# Patient Record
Sex: Female | Born: 1975 | Race: Asian | Hispanic: No | Marital: Single | Smoking: Never smoker
Health system: Southern US, Community
[De-identification: ages and names within clinical notes are randomized; demographics above are authoritative.]

## PROBLEM LIST (undated history)

## (undated) DIAGNOSIS — Z789 Other specified health status: Secondary | ICD-10-CM

---

## 2013-07-24 ENCOUNTER — Encounter (HOSPITAL_BASED_OUTPATIENT_CLINIC_OR_DEPARTMENT_OTHER): Payer: Self-pay | Admitting: *Deleted

## 2013-07-24 ENCOUNTER — Emergency Department (HOSPITAL_BASED_OUTPATIENT_CLINIC_OR_DEPARTMENT_OTHER)
Admission: EM | Admit: 2013-07-24 | Discharge: 2013-07-24 | Disposition: A | Discharge status: Home / Self Care | Payer: No Typology Code available for payment source | Attending: Emergency Medicine | Admitting: Emergency Medicine

## 2013-07-24 ENCOUNTER — Emergency Department (HOSPITAL_BASED_OUTPATIENT_CLINIC_OR_DEPARTMENT_OTHER): Payer: No Typology Code available for payment source

## 2013-07-24 DIAGNOSIS — Y9389 Activity, other specified: Secondary | ICD-10-CM | POA: Insufficient documentation

## 2013-07-24 DIAGNOSIS — S8010XA Contusion of unspecified lower leg, initial encounter: Secondary | ICD-10-CM | POA: Insufficient documentation

## 2013-07-24 DIAGNOSIS — IMO0002 Reserved for concepts with insufficient information to code with codable children: Secondary | ICD-10-CM | POA: Insufficient documentation

## 2013-07-24 DIAGNOSIS — S8011XA Contusion of right lower leg, initial encounter: Secondary | ICD-10-CM

## 2013-07-24 DIAGNOSIS — Y92410 Unspecified street and highway as the place of occurrence of the external cause: Secondary | ICD-10-CM | POA: Insufficient documentation

## 2013-07-24 MED ORDER — IBUPROFEN 800 MG PO TABS
800.0000 mg | ORAL_TABLET | Freq: Once | ORAL | Status: AC
  Administered 2013-07-24: 800 mg via ORAL
  Filled 2013-07-24: qty 1
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
## 2014-11-11 ENCOUNTER — Other Ambulatory Visit: Payer: Self-pay

## 2014-11-11 ENCOUNTER — Ambulatory Visit (INDEPENDENT_AMBULATORY_CARE_PROVIDER_SITE_OTHER): Payer: BLUE CROSS/BLUE SHIELD | Admitting: Gynecology

## 2014-11-11 ENCOUNTER — Encounter: Payer: Self-pay | Admitting: Gynecology

## 2014-11-11 ENCOUNTER — Other Ambulatory Visit (HOSPITAL_COMMUNITY)
Admission: RE | Admit: 2014-11-11 | Discharge: 2014-11-11 | Disposition: A | Discharge status: Home / Self Care | Payer: BLUE CROSS/BLUE SHIELD | Source: Ambulatory Visit | Attending: Gynecology | Admitting: Gynecology

## 2014-11-11 VITALS — BP 100/68 | Ht 60.25 in | Wt 113.0 lb

## 2014-11-11 DIAGNOSIS — N8320 Unspecified ovarian cysts: Secondary | ICD-10-CM

## 2014-11-11 DIAGNOSIS — Z01411 Encounter for gynecological examination (general) (routine) with abnormal findings: Secondary | ICD-10-CM | POA: Diagnosis present

## 2014-11-11 DIAGNOSIS — Z1151 Encounter for screening for human papillomavirus (HPV): Secondary | ICD-10-CM | POA: Insufficient documentation

## 2014-11-11 DIAGNOSIS — Z124 Encounter for screening for malignant neoplasm of cervix: Secondary | ICD-10-CM

## 2014-11-11 DIAGNOSIS — N83201 Unspecified ovarian cyst, right side: Secondary | ICD-10-CM

## 2014-11-11 DIAGNOSIS — Z1231 Encounter for screening mammogram for malignant neoplasm of breast: Secondary | ICD-10-CM

## 2014-11-11 NOTE — Progress Notes (Signed)
   Patient is a 39 year old gravida 1 para 1 who was referred [facility] our [facility] as a result of an ultrasound that had been ordered in November 2015 whereby there was concern of irregularity in the uterus at time of the pelvic exam. Patient with no prior history of Pap smear. Patient with no prior history of mammogram. Patient reports having a normal menstrual cycle every 25 days. She is using condoms for contraception. Patient is otherwise healthy. She was asymptomatic today.  Report from November 2015 as follows: Uterus measured 11.6 x 4.7 x 6.1 cm no discrete uterine masses reported. Right ovary measures 4 x 1.9 x 2.57 m. Left ovary measured 2.3 x 1.8 x 1.7 cm. There was a reported finding compatible with a 1.9 cm complex cyst within the right ovary there was a suggestion of a 1.4 cm right ovarian calcification with a possible diagnosis of an ovarian dermoid cyst.  Exam: Abdomen: Soft nontender no rebound or guarding Pelvic: Bartholin urethra Skene glands within normal limits Vagina some menstrual blood was noted Cervix: Some menstrual blood noted Uterus anteverted normal size shape and consistency adnexa with no palpable mass or tenderness Rectal exam not done  Assessment/plan: #1 incidental finding at time of ultrasound of a possible small right dermoid cyst. We will obtain a CA-125 today and follow-up with ultrasound first week in March. Information on ovarian cysts and on CA-125 was provided. Pap smear was done today. #2 patient was provided with a requisition to contact the radiologist for a screening mammogram.

## 2014-11-11 NOTE — Patient Instructions (Signed)
CA-125 Tumor Marker CA 125 is a tumor marker that is used to help monitor the course of ovarian or endometrial cancer. PREPARATION FOR TEST No preparation is necessary. NORMAL FINDINGS Adults: 0-35 units/mL (0-35 kilounits)/L Ranges for normal findings may vary among different laboratories and hospitals. You should always check with your doctor after having lab work or other tests done to discuss the meaning of your test results and whether your values are considered within normal limits. MEANING OF TEST  Your caregiver will go over the test results with you and discuss the importance and meaning of your results, as well as treatment options and the need for additional tests if necessary. OBTAINING THE TEST RESULTS It is your responsibility to obtain your test results. Ask the lab or department performing the test when and how you will get your results. Document Released: 11/16/2004 Document Revised: 01/17/2012 Document Reviewed: 10/02/2008 ExitCare Patient Information 2015 [employer]. This information is not intended to replace advice given to you by your health care provider. Make sure you discuss any questions you have with your health care provider. Ovarian Cyst An ovarian cyst is a fluid-filled sac that forms on an ovary. The ovaries are small organs that produce eggs in women. Various types of cysts can form on the ovaries. Most are not cancerous. Many do not cause problems, and they often go away on their own. Some may cause symptoms and require treatment. Common types of ovarian cysts include:  Functional cysts--These cysts may occur every month during the menstrual cycle. This is normal. The cysts usually go away with the next menstrual cycle if the woman does not get pregnant. Usually, there are no symptoms with a functional cyst.  Endometrioma cysts--These cysts form from the tissue that lines the uterus. They are also called "chocolate cysts" because they become filled with blood  that turns brown. This type of cyst can cause pain in the lower abdomen during intercourse and with your menstrual period.  Cystadenoma cysts--This type develops from the cells on the outside of the ovary. These cysts can get very big and cause lower abdomen pain and pain with intercourse. This type of cyst can twist on itself, cut off its blood supply, and cause severe pain. It can also easily rupture and cause a lot of pain.  Dermoid cysts--This type of cyst is sometimes found in both ovaries. These cysts may contain different kinds of body tissue, such as skin, teeth, hair, or cartilage. They usually do not cause symptoms unless they get very big.  Theca lutein cysts--These cysts occur when too much of a certain hormone (human chorionic gonadotropin) is produced and overstimulates the ovaries to produce an egg. This is most common after procedures used to assist with the conception of a baby (in vitro fertilization). CAUSES   Fertility drugs can cause a condition in which multiple large cysts are formed on the ovaries. This is called ovarian hyperstimulation syndrome.  A condition called polycystic ovary syndrome can cause hormonal imbalances that can lead to nonfunctional ovarian cysts. SIGNS AND SYMPTOMS  Many ovarian cysts do not cause symptoms. If symptoms are present, they may include:  Pelvic pain or pressure.  Pain in the lower abdomen.  Pain during sexual intercourse.  Increasing girth (swelling) of the abdomen.  Abnormal menstrual periods.  Increasing pain with menstrual periods.  Stopping having menstrual periods without being pregnant. DIAGNOSIS  These cysts are commonly found during a routine or annual pelvic exam. Tests may be ordered to find   out more about the cyst. These tests may include:  Ultrasound.  X-ray of the pelvis.  CT scan.  MRI.  Blood tests. TREATMENT  Many ovarian cysts go away on their own without treatment. Your health care provider may want  to check your cyst regularly for 2-3 months to see if it changes. For women in menopause, it is particularly important to monitor a cyst closely because of the higher rate of ovarian cancer in menopausal women. When treatment is needed, it may include any of the following:  A procedure to drain the cyst (aspiration). This may be done using a long needle and ultrasound. It can also be done through a laparoscopic procedure. This involves using a thin, lighted tube with a tiny camera on the end (laparoscope) inserted through a small incision.  Surgery to remove the whole cyst. This may be done using laparoscopic surgery or an open surgery involving a larger incision in the lower abdomen.  Hormone treatment or birth control pills. These methods are sometimes used to help dissolve a cyst. HOME CARE INSTRUCTIONS   Only take over-the-counter or prescription medicines as directed by your health care provider.  Follow up with your health care provider as directed.  Get regular pelvic exams and Pap tests. SEEK MEDICAL CARE IF:   Your periods are late, irregular, or painful, or they stop.  Your pelvic pain or abdominal pain does not go away.  Your abdomen becomes larger or swollen.  You have pressure on your bladder or trouble emptying your bladder completely.  You have pain during sexual intercourse.  You have feelings of fullness, pressure, or discomfort in your stomach.  You lose weight for no apparent reason.  You feel generally ill.  You become constipated.  You lose your appetite.  You develop acne.  You have an increase in body and facial hair.  You are gaining weight, without changing your exercise and eating habits.  You think you are pregnant. SEEK IMMEDIATE MEDICAL CARE IF:   You have increasing abdominal pain.  You feel sick to your stomach (nauseous), and you throw up (vomit).  You develop a fever that comes on suddenly.  You have abdominal pain during a bowel  movement.  Your menstrual periods become heavier than usual. MAKE SURE YOU:  Understand these instructions.  Will watch your condition.  Will get help right away if you are not doing well or get worse. Document Released: 10/25/2005 Document Revised: 10/30/2013 Document Reviewed: 07/02/2013 ExitCare Patient Information 2015 [employer]. This information is not intended to replace advice given to you by your health care provider. Make sure you discuss any questions you have with your health care provider.  

## 2014-11-12 ENCOUNTER — Other Ambulatory Visit: Payer: Self-pay | Admitting: Gynecology

## 2014-11-12 DIAGNOSIS — R971 Elevated cancer antigen 125 [CA 125]: Secondary | ICD-10-CM

## 2014-11-12 LAB — CA 125: CA 125: 35 U/mL — ABNORMAL HIGH (ref <35)

## 2014-11-13 ENCOUNTER — Ambulatory Visit
Admission: RE | Admit: 2014-11-13 | Discharge: 2014-11-13 | Disposition: A | Discharge status: Home / Self Care | Payer: Self-pay | Source: Ambulatory Visit

## 2014-11-13 DIAGNOSIS — Z1231 Encounter for screening mammogram for malignant neoplasm of breast: Secondary | ICD-10-CM

## 2014-11-13 LAB — CYTOLOGY - PAP

## 2014-12-16 ENCOUNTER — Other Ambulatory Visit: Payer: BLUE CROSS/BLUE SHIELD

## 2014-12-16 DIAGNOSIS — R971 Elevated cancer antigen 125 [CA 125]: Secondary | ICD-10-CM

## 2014-12-17 LAB — CA 125: CA 125: 42 U/mL — AB (ref <35)

## 2014-12-17 NOTE — Progress Notes (Signed)
No discussed that with her that day

## 2014-12-18 ENCOUNTER — Other Ambulatory Visit: Payer: Self-pay

## 2014-12-18 ENCOUNTER — Ambulatory Visit: Payer: Self-pay | Admitting: Gynecology

## 2014-12-23 ENCOUNTER — Ambulatory Visit: Payer: Self-pay | Admitting: Gynecology

## 2014-12-23 ENCOUNTER — Other Ambulatory Visit: Payer: Self-pay

## 2014-12-25 ENCOUNTER — Encounter: Payer: Self-pay | Admitting: Gynecology

## 2014-12-25 ENCOUNTER — Ambulatory Visit (INDEPENDENT_AMBULATORY_CARE_PROVIDER_SITE_OTHER): Payer: BLUE CROSS/BLUE SHIELD | Admitting: Gynecology

## 2014-12-25 ENCOUNTER — Ambulatory Visit (INDEPENDENT_AMBULATORY_CARE_PROVIDER_SITE_OTHER): Payer: BLUE CROSS/BLUE SHIELD

## 2014-12-25 ENCOUNTER — Other Ambulatory Visit: Payer: Self-pay | Admitting: Gynecology

## 2014-12-25 DIAGNOSIS — N838 Other noninflammatory disorders of ovary, fallopian tube and broad ligament: Secondary | ICD-10-CM

## 2014-12-25 DIAGNOSIS — N831 Corpus luteum cyst of ovary, unspecified side: Secondary | ICD-10-CM

## 2014-12-25 DIAGNOSIS — N83201 Unspecified ovarian cyst, right side: Secondary | ICD-10-CM

## 2014-12-25 DIAGNOSIS — N8320 Unspecified ovarian cysts: Secondary | ICD-10-CM

## 2014-12-25 DIAGNOSIS — N839 Noninflammatory disorder of ovary, fallopian tube and broad ligament, unspecified: Secondary | ICD-10-CM

## 2014-12-25 DIAGNOSIS — R971 Elevated cancer antigen 125 [CA 125]: Secondary | ICD-10-CM

## 2014-12-25 NOTE — Patient Instructions (Signed)
Diagnostic Laparoscopy Laparoscopy is a surgical procedure. It is used to diagnose and treat diseases inside the belly (abdomen). It is usually a brief, common, and relatively simple procedure. The laparoscopeis a thin, lighted, pencil-sized instrument. It is like a telescope. It is inserted into your abdomen through a small cut (incision). Your caregiver can look at the organs inside your body through this instrument. He or she can see if there is anything abnormal. Laparoscopy can be done either in a hospital or outpatient clinic. You may be given a mild sedative to help you relax before the procedure. Once in the operating room, you will be given a drug to make you sleep (general anesthesia). Laparoscopy usually lasts less than 1 hour. After the procedure, you will be monitored in a recovery area until you are stable and doing well. Once you are home, it will take 2 to 3 days to fully recover. RISKS AND COMPLICATIONS  Laparoscopy has relatively few risks. Your caregiver will discuss the risks with you before the procedure. Some problems that can occur include:  Infection.  Bleeding.  Damage to other organs.  Anesthetic side effects. PROCEDURE Once you receive anesthesia, your surgeon inflates the abdomen with a harmless gas (carbon dioxide). This makes the organs easier to see. The laparoscope is inserted into the abdomen through a small incision. This allows your surgeon to see into the abdomen. Other small instruments are also inserted into the abdomen through other small openings. Many surgeons attach a video camera to the laparoscope to enlarge the view. During a diagnostic laparoscopy, the surgeon may be looking for inflammation, infection, or cancer. Your surgeon may take tissue samples(biopsies). The samples are sent to a specialist in looking at cells and tissue samples (pathologist). The pathologist examines them under a microscope. Biopsies can help to diagnose or confirm a  disease. AFTER THE PROCEDURE   The gas is released from inside the abdomen.  The incisions are closed with stitches (sutures). Because these incisions are small (usually less than 1/2 inch), there is usually minimal discomfort after the procedure. There may be some mild discomfort in the throat. This is from the tube placed in the throat while you were sleeping. You may have some mild abdominal discomfort. There may also be discomfort from the instrument placement incisions in the abdomen.  The recovery time is shortened as long as there are no complications.  You will rest in a recovery room until stable and doing well. As long as there are no complications, you may be allowed to go home. FINDING OUT THE RESULTS OF YOUR TEST Not all test results are available during your visit. If your test results are not back during the visit, make an appointment with your caregiver to find out the results. Do not assume everything is normal if you have not heard from your caregiver or the medical facility. It is important for you to follow up on all of your test results. HOME CARE INSTRUCTIONS   Take all medicines as directed.  Only take over-the-counter or prescription medicines for pain, discomfort, or fever as directed by your caregiver.  Resume daily activities as directed.  Showers are preferred over baths.  You may resume sexual activities in 1 week or as directed.  Do not drive while taking narcotics. SEEK MEDICAL CARE IF:   There is increasing abdominal pain.  There is new pain in the shoulders (shoulder strap areas).  You feel lightheaded or faint.  You have the chills.  You or   your child has an oral temperature above 102 F (38.9 C).  There is pus-like (purulent) drainage from any of the wounds.  You are unable to pass gas or have a bowel movement.  You feel sick to your stomach (nauseous) or throw up (vomit). MAKE SURE YOU:   Understand these instructions.  Will watch  your condition.  Will get help right away if you are not doing well or get worse. Document Released: 01/31/2001 Document Revised: 02/19/2013 Document Reviewed: 10/25/2007 ExitCare Patient Information 2015 [employer]. This information is not intended to replace advice given to you by your health care provider. Make sure you discuss any questions you have with your health care provider. Ovarian Cyst An ovarian cyst is a fluid-filled sac that forms on an ovary. The ovaries are small organs that produce eggs in women. Various types of cysts can form on the ovaries. Most are not cancerous. Many do not cause problems, and they often go away on their own. Some may cause symptoms and require treatment. Common types of ovarian cysts include:  Functional cysts--These cysts may occur every month during the menstrual cycle. This is normal. The cysts usually go away with the next menstrual cycle if the woman does not get pregnant. Usually, there are no symptoms with a functional cyst.  Endometrioma cysts--These cysts form from the tissue that lines the uterus. They are also called "chocolate cysts" because they become filled with blood that turns brown. This type of cyst can cause pain in the lower abdomen during intercourse and with your menstrual period.  Cystadenoma cysts--This type develops from the cells on the outside of the ovary. These cysts can get very big and cause lower abdomen pain and pain with intercourse. This type of cyst can twist on itself, cut off its blood supply, and cause severe pain. It can also easily rupture and cause a lot of pain.  Dermoid cysts--This type of cyst is sometimes found in both ovaries. These cysts may contain different kinds of body tissue, such as skin, teeth, hair, or cartilage. They usually do not cause symptoms unless they get very big.  Theca lutein cysts--These cysts occur when too much of a certain hormone (human chorionic gonadotropin) is produced and  overstimulates the ovaries to produce an egg. This is most common after procedures used to assist with the conception of a baby (in vitro fertilization). CAUSES   Fertility drugs can cause a condition in which multiple large cysts are formed on the ovaries. This is called ovarian hyperstimulation syndrome.  A condition called polycystic ovary syndrome can cause hormonal imbalances that can lead to nonfunctional ovarian cysts. SIGNS AND SYMPTOMS  Many ovarian cysts do not cause symptoms. If symptoms are present, they may include:  Pelvic pain or pressure.  Pain in the lower abdomen.  Pain during sexual intercourse.  Increasing girth (swelling) of the abdomen.  Abnormal menstrual periods.  Increasing pain with menstrual periods.  Stopping having menstrual periods without being pregnant. DIAGNOSIS  These cysts are commonly found during a routine or annual pelvic exam. Tests may be ordered to find out more about the cyst. These tests may include:  Ultrasound.  X-ray of the pelvis.  CT scan.  MRI.  Blood tests. TREATMENT  Many ovarian cysts go away on their own without treatment. Your health care provider may want to check your cyst regularly for 2-3 months to see if it changes. For women in menopause, it is particularly important to monitor a cyst closely because   of the higher rate of ovarian cancer in menopausal women. When treatment is needed, it may include any of the following:  A procedure to drain the cyst (aspiration). This may be done using a long needle and ultrasound. It can also be done through a laparoscopic procedure. This involves using a thin, lighted tube with a tiny camera on the end (laparoscope) inserted through a small incision.  Surgery to remove the whole cyst. This may be done using laparoscopic surgery or an open surgery involving a larger incision in the lower abdomen.  Hormone treatment or birth control pills. These methods are sometimes used to help  dissolve a cyst. HOME CARE INSTRUCTIONS   Only take over-the-counter or prescription medicines as directed by your health care provider.  Follow up with your health care provider as directed.  Get regular pelvic exams and Pap tests. SEEK MEDICAL CARE IF:   Your periods are late, irregular, or painful, or they stop.  Your pelvic pain or abdominal pain does not go away.  Your abdomen becomes larger or swollen.  You have pressure on your bladder or trouble emptying your bladder completely.  You have pain during sexual intercourse.  You have feelings of fullness, pressure, or discomfort in your stomach.  You lose weight for no apparent reason.  You feel generally ill.  You become constipated.  You lose your appetite.  You develop acne.  You have an increase in body and facial hair.  You are gaining weight, without changing your exercise and eating habits.  You think you are pregnant. SEEK IMMEDIATE MEDICAL CARE IF:   You have increasing abdominal pain.  You feel sick to your stomach (nauseous), and you throw up (vomit).  You develop a fever that comes on suddenly.  You have abdominal pain during a bowel movement.  Your menstrual periods become heavier than usual. MAKE SURE YOU:  Understand these instructions.  Will watch your condition.  Will get help right away if you are not doing well or get worse. Document Released: 10/25/2005 Document Revised: 10/30/2013 Document Reviewed: 07/02/2013 Exit[employer] Patient Information 2015 [employer]. This information is not intended to replace advice given to you by your health care provider. Make sure you discuss any questions you have with your health care provider.  

## 2014-12-25 NOTE — Progress Notes (Signed)
   Patient's a 39 year old who presented to the office today for follow-up on right ovarian cyst. Patient was seen the office for the first time on 11/11/2014 with the following history:  Patient is a gravida 1 para 1 (prior cesarean section) [place] female who was [facility] our practice [facility] as a result of an ultrasound that had been ordered in November 2015 whereby there was concern of irregularity in the uterus at time of the pelvic exam. Patient with no prior history of Pap smear. Patient with no prior history of mammogram. Patient reports having a normal menstrual cycle every 25 days. She is using condoms for contraception. Patient is otherwise healthy. She was asymptomatic today.  Report from November 2015 as follows: Uterus measured 11.6 x 4.7 x 6.1 cm no discrete uterine masses reported. Right ovary measures 4 x 1.9 x 2.57 m. Left ovary measured 2.3 x 1.8 x 1.7 cm. 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  Ultrasound: Uterus measured 10.5 x 6.7 x 5.1 cm with endometrial stripe of 10.9 mm. Right ovarian solid mass measuring 18 x 19 x 19 mm with calcified wall was noted some posterior shadowing was noted negative color flow. Arterial blood flow was seen in the right ovary. Left ovary corpus luteum cyst measuring 2.9 x 1.8 x 1.8 cm with positive color flow in the periphery.  CA 125 was 4942  Assessment/plan: 39 year old gravida 1 para 1 (cesarean section) using condoms for contraception with a calcified right ovarian cyst unchanged in size from November. Concerned of increase CA 125 from a value of 35-42. We discussed recommendation of proceeding with right ovarian cystectomy possible right salpingo-oophorectomy and since she's planning on having no more children proceeding  with left salpingectomy to complete the sterilization. She was here present with her husband and interpreter. Literature information was provided. We will try to coordinate the operation if she decides to move forward and see her for preop exam the week prior to her surgery.

## 2014-12-26 ENCOUNTER — Telehealth: Payer: Self-pay

## 2014-12-26 NOTE — Telephone Encounter (Signed)
I called patient regarding scheduling surgery. She tells me she is "still thinking about it".  She said she will get in touch with me when she is ready to schedule.

## 2015-01-15 ENCOUNTER — Other Ambulatory Visit: Payer: Self-pay

## 2015-01-15 ENCOUNTER — Ambulatory Visit: Payer: Self-pay | Admitting: Gynecology

## 2015-01-29 ENCOUNTER — Ambulatory Visit: Payer: BLUE CROSS/BLUE SHIELD | Admitting: Gynecology

## 2015-02-05 ENCOUNTER — Encounter (HOSPITAL_COMMUNITY)
Admission: RE | Admit: 2015-02-05 | Discharge: 2015-02-05 | Disposition: A | Discharge status: Home / Self Care | Payer: BLUE CROSS/BLUE SHIELD | Source: Ambulatory Visit | Attending: Gynecology | Admitting: Gynecology

## 2015-02-05 ENCOUNTER — Encounter: Payer: Self-pay | Admitting: Gynecology

## 2015-02-05 ENCOUNTER — Encounter (HOSPITAL_COMMUNITY): Payer: Self-pay

## 2015-02-05 ENCOUNTER — Ambulatory Visit (INDEPENDENT_AMBULATORY_CARE_PROVIDER_SITE_OTHER): Payer: BLUE CROSS/BLUE SHIELD | Admitting: Gynecology

## 2015-02-05 VITALS — BP 114/68

## 2015-02-05 DIAGNOSIS — Z01812 Encounter for preprocedural laboratory examination: Secondary | ICD-10-CM | POA: Insufficient documentation

## 2015-02-05 DIAGNOSIS — R971 Elevated cancer antigen 125 [CA 125]: Secondary | ICD-10-CM

## 2015-02-05 DIAGNOSIS — N8320 Unspecified ovarian cysts: Secondary | ICD-10-CM

## 2015-02-05 DIAGNOSIS — N83201 Unspecified ovarian cyst, right side: Secondary | ICD-10-CM

## 2015-02-05 DIAGNOSIS — Z01818 Encounter for other preprocedural examination: Secondary | ICD-10-CM

## 2015-02-05 HISTORY — DX: Other specified health status: Z78.9

## 2015-02-05 LAB — CBC
HCT: 35.8 % — ABNORMAL LOW (ref 36.0–46.0)
Hemoglobin: 11.9 g/dL — ABNORMAL LOW (ref 12.0–15.0)
MCH: 29.7 pg (ref 26.0–34.0)
MCHC: 33.2 g/dL (ref 30.0–36.0)
MCV: 89.3 fL (ref 78.0–100.0)
Platelets: 402 10*3/uL — ABNORMAL HIGH (ref 150–400)
RBC: 4.01 MIL/uL (ref 3.87–5.11)
RDW: 14.2 % (ref 11.5–15.5)
WBC: 6.1 10*3/uL (ref 4.0–10.5)

## 2015-02-05 MED ORDER — METOCLOPRAMIDE HCL 10 MG PO TABS: 10.0000 mg | ORAL_TABLET | Freq: Three times a day (TID) | ORAL | Status: AC

## 2015-02-05 MED ORDER — OXYCODONE-ACETAMINOPHEN 7.5-325 MG PO TABS: 1.0000 | ORAL_TABLET | ORAL | Status: AC | PRN

## 2015-02-05 NOTE — Patient Instructions (Signed)

## 2015-02-05 NOTE — Progress Notes (Signed)
 Lauren Gillespie is an 39 y.o. female. Who presents to the office today for her preoperative examination. Patient with known history of a right ovarian cyst.Patient was seen the office for the first time on 11/11/2014 with the following history:  Patient is a gravida 1 para 1 (prior cesarean section) Chinese female who was referred to our practice [facility] as a result of an ultrasound that had been ordered in November 2015 whereby there was concern of irregularity in the uterus at time of the pelvic exam. Patient with no prior history of Pap smear. Patient with no prior history of mammogram. Patient reports having a normal menstrual cycle every 25 days. She is using condoms for contraception. Patient is otherwise healthy. She was asymptomatic today.  Report from November 2015 as follows: Uterus measured 11.6 x 4.7 x 6.1 cm no discrete uterine masses reported. Right ovary measures 4 x 1.9 x 2.57 m. Left ovary measured 2.3 x 1.8 x 1.7 cm. 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  Ultrasound was repeated on 12/25/2014 as follows: Uterus measured 10.5 x 6.7 x 5.1 cm with endometrial stripe of 10.9 mm. Right ovarian solid mass measuring 18 x 19 x 19 mm with calcified wall was noted some posterior shadowing was noted negative color flow. Arterial blood flow was seen in the right ovary. Left ovary corpus luteum cyst measuring 2.9 x 1.8 x 1.8 cm with positive color flow in the periphery.  CA 125 was 42  Patient with persistent calcified right ovarian cyst unchanged in size from November. Concerned of increase CA 125 from a value of 35-42. We discussed recommendation of proceeding with right ovarian cystectomy possible right salpingo-oophorectomy and since she's planning on having no more  children proceeding with left salpingectomy to complete the sterilization.   Pertinent Gynecological History: Menses: Regular Bleeding: Normal Contraception: condoms DES exposure: unknown Blood transfusions: none Sexually transmitted diseases: no past history Previous GYN Procedures: Cesarean section  Last mammogram: normal Date: 2016 Last pap: normal Date: 2016 OB History: G 1, P 1   Menstrual History: Menarche age: 11 Patient's last menstrual period was 02/02/2015.    No past medical history on file.  Past Surgical History  Procedure Laterality Date  . Cesarean section      Family History  Problem Relation Age of Onset  . Hypertension Father   . Diabetes Father     Social History:  reports that she has never smoked. She does not have any smokeless tobacco history on file. She reports that she does not drink alcohol or use illicit drugs.  Allergies: No Known Allergies   (Not in a hospital admission)  REVIEW OF SYSTEMS: A ROS was performed and pertinent positives and negatives are included in the history.  GENERAL: No fevers or chills. HEENT: No change in vision, no earache, sore throat or sinus congestion. NECK: No pain or stiffness. CARDIOVASCULAR: No chest pain or pressure. No palpitations. PULMONARY: No shortness of breath, cough or wheeze. GASTROINTESTINAL: No abdominal pain, nausea, vomiting or diarrhea, melena or bright red blood per rectum. GENITOURINARY: No urinary frequency, urgency, hesitancy or dysuria. MUSCULOSKELETAL: No joint or muscle pain, no back pain, no recent trauma. DERMATOLOGIC: No rash, no itching, no lesions. ENDOCRINE: No polyuria, polydipsia,   no heat or cold intolerance. No recent change in weight. HEMATOLOGICAL: No anemia or easy bruising or bleeding. NEUROLOGIC: No headache, seizures, numbness, tingling or weakness. PSYCHIATRIC: No depression, no loss of interest in normal activity or change in sleep pattern.     Blood pressure 114/68, last  menstrual period 02/02/2015.  Physical Exam:  HEENT:unremarkable Neck:Supple, midline, no thyroid megaly, no carotid bruits Lungs:  Clear to auscultation no rhonchi's or wheezes Heart:Regular rate and rhythm, no murmurs or gallops Breast Exam: Symmetrical in appearance no skin discoloration or nipple inversion no supraclavicular axillary lymphadenopathy Abdomen: Soft nontender no rebound or guarding Pelvic:BUS within normal limits Vagina: Menstrual blood present Cervix: Menstrual blood present Uterus: Anteverted normal size shape and consistency Adnexa: No palpable mass or tenderness Extremities: No cords, no edema Rectal: Deferred   Assessment/plan: Patient with persistent calcified right ovarian cyst unchanged in size from November. Concerned of increase CA 125 from a value of 35-42. Patient will be scheduled to undergo laparoscopic right salpingo-oophorectomy along with left salpingectomy. Patient fully where she will no longer to have children. Patient fully except. Additional risk as follows:                        Patient was counseled as to the risk of surgery to include the following:  1. Infection (prohylactic antibiotics will be administered)  2. DVT/Pulmonary Embolism (prophylactic pneumo compression stockings will be used)  3.Trauma to internal organs requiring additional surgical procedure to repair any injury to     Internal organs requiring perhaps additional hospitalization days.  4.Hemmorhage requiring transfusion and blood products which carry risks such as             anaphylactic reaction, hepatitis and AIDS  Patient had received literature information on the procedure scheduled and all her questions were answered and fully accepts all risk.   Lauren Gillespie HMD1:18 PMTD     Lauren Gillespie H 02/05/2015, 12:51 PM  Note: This dictation was prepared with  Dragon/digital dictation along withSmart phrase technology. Any transcriptional errors that result from  this process are unintentional.  

## 2015-02-05 NOTE — Patient Instructions (Signed)
Your procedure is scheduled on:02/11/15  Enter through the Main Entrance at :6:00am Pick up desk phone and dial [phone] and inform us of your arrival.  Please call [phone] if you have any problems the morning of surgery.  Remember: Do not eat food or drink liquids, including water, after midnight:Monday 02/10/15   You may brush your teeth the morning of surgery  DO NOT wear jewelry, eye make-up, lipstick,body lotion, or dark fingernail polish.  (Polished toes are ok) You may wear deodorant.  If you are to be admitted after surgery, leave suitcase in car until your room has been assigned. Patients discharged on the day of surgery will not be allowed to drive home. Wear loose fitting, comfortable clothes for your ride home.

## 2015-02-05 NOTE — Pre-Procedure Instructions (Signed)
Pt could not obtain urine specimen for UA at PAT appt. Will need to be done on DOS

## 2015-02-06 ENCOUNTER — Ambulatory Visit: Payer: BLUE CROSS/BLUE SHIELD | Admitting: Gynecology

## 2015-02-07 ENCOUNTER — Other Ambulatory Visit (HOSPITAL_COMMUNITY): Payer: Self-pay

## 2015-02-10 ENCOUNTER — Encounter (HOSPITAL_COMMUNITY): Payer: Self-pay | Admitting: Anesthesiology

## 2015-02-10 MED ORDER — CEFOTETAN DISODIUM 2 G IJ SOLR
2.0000 g | INTRAMUSCULAR | Status: AC
  Administered 2015-02-11: 2 g via INTRAVENOUS
  Filled 2015-02-10: qty 2

## 2015-02-10 NOTE — Anesthesia Preprocedure Evaluation (Addendum)
Anesthesia Evaluation  Patient identified by MRN, date of birth, ID band Patient awake    Reviewed: Allergy & Precautions, NPO status , Patient's Chart, lab work & pertinent test results  Airway Mallampati: II  TM Distance: >3 FB Neck ROM: Full    Dental no notable dental hx. (+) Teeth Intact   Pulmonary neg pulmonary ROS,  breath sounds clear to auscultation  Pulmonary exam normal       Cardiovascular negative cardio ROS  Rhythm:Regular Rate:Normal     Neuro/Psych negative neurological ROS  negative psych ROS   GI/Hepatic Neg liver ROS,   Endo/Other  negative endocrine ROS  Renal/GU negative Renal ROS  negative genitourinary   Musculoskeletal negative musculoskeletal ROS (+)   Abdominal   Peds  Hematology  (+) anemia ,   Anesthesia Other Findings   Reproductive/Obstetrics Right Ovarian Cyst Elevated CA-125 Desires sterilization                           Anesthesia Physical Anesthesia Plan  ASA: I  Anesthesia Plan: General   Post-op Pain Management:    Induction: Intravenous  Airway Management Planned: Oral ETT  Additional Equipment:   Intra-op Plan:   Post-operative Plan: Extubation in OR  Informed Consent: I have reviewed the patients History and Physical, chart, labs and discussed the procedure including the risks, benefits and alternatives for the proposed anesthesia with the patient or authorized representative who has indicated his/her understanding and acceptance.   Dental advisory given  Plan Discussed with: CRNA, Anesthesiologist and Surgeon  Anesthesia Plan Comments:         Anesthesia Quick Evaluation

## 2015-02-11 ENCOUNTER — Ambulatory Visit (HOSPITAL_COMMUNITY)
Admission: RE | Admit: 2015-02-11 | Discharge: 2015-02-11 | Disposition: A | Discharge status: Home / Self Care | Payer: BLUE CROSS/BLUE SHIELD | Source: Ambulatory Visit | Attending: Gynecology | Admitting: Gynecology

## 2015-02-11 ENCOUNTER — Ambulatory Visit (HOSPITAL_COMMUNITY): Payer: BLUE CROSS/BLUE SHIELD | Admitting: Anesthesiology

## 2015-02-11 ENCOUNTER — Encounter (HOSPITAL_COMMUNITY): Payer: Self-pay | Admitting: *Deleted

## 2015-02-11 ENCOUNTER — Encounter (HOSPITAL_COMMUNITY)
Admission: RE | Disposition: A | Discharge status: Home / Self Care | Payer: Self-pay | Source: Ambulatory Visit | Attending: Gynecology

## 2015-02-11 DIAGNOSIS — N838 Other noninflammatory disorders of ovary, fallopian tube and broad ligament: Secondary | ICD-10-CM | POA: Insufficient documentation

## 2015-02-11 DIAGNOSIS — N8320 Unspecified ovarian cysts: Secondary | ICD-10-CM | POA: Diagnosis not present

## 2015-02-11 DIAGNOSIS — Z302 Encounter for sterilization: Secondary | ICD-10-CM | POA: Diagnosis not present

## 2015-02-11 DIAGNOSIS — N830 Follicular cyst of ovary: Secondary | ICD-10-CM | POA: Insufficient documentation

## 2015-02-11 DIAGNOSIS — R971 Elevated cancer antigen 125 [CA 125]: Secondary | ICD-10-CM | POA: Diagnosis not present

## 2015-02-11 HISTORY — PX: LAPAROSCOPIC UNILATERAL SALPINGECTOMY: SHX5934

## 2015-02-11 HISTORY — PX: LAPAROSCOPIC SALPINGO OOPHERECTOMY: SHX5927

## 2015-02-11 LAB — URINALYSIS, ROUTINE W REFLEX MICROSCOPIC
BILIRUBIN URINE: NEGATIVE
Glucose, UA: NEGATIVE mg/dL
Hgb urine dipstick: NEGATIVE
KETONES UR: NEGATIVE mg/dL
Leukocytes, UA: NEGATIVE
Nitrite: NEGATIVE
PH: 5.5 (ref 5.0–8.0)
Protein, ur: NEGATIVE mg/dL
Specific Gravity, Urine: 1.025 (ref 1.005–1.030)
Urobilinogen, UA: 0.2 mg/dL (ref 0.0–1.0)

## 2015-02-11 LAB — PREGNANCY, URINE: Preg Test, Ur: NEGATIVE

## 2015-02-11 SURGERY — SALPINGO-OOPHORECTOMY, LAPAROSCOPIC
Anesthesia: General | Laterality: Right

## 2015-02-11 MED ORDER — BUPIVACAINE HCL (PF) 0.25 % IJ SOLN
INTRAMUSCULAR | Status: DC | PRN
  Administered 2015-02-11: 18 mL

## 2015-02-11 MED ORDER — ONDANSETRON HCL 4 MG/2ML IJ SOLN
INTRAMUSCULAR | Status: DC | PRN
  Administered 2015-02-11: 4 mg via INTRAVENOUS

## 2015-02-11 MED ORDER — DEXAMETHASONE SODIUM PHOSPHATE 4 MG/ML IJ SOLN
INTRAMUSCULAR | Status: AC
  Filled 2015-02-11: qty 1

## 2015-02-11 MED ORDER — NEOSTIGMINE METHYLSULFATE 10 MG/10ML IV SOLN
INTRAVENOUS | Status: DC | PRN
  Administered 2015-02-11: 3 mg via INTRAVENOUS

## 2015-02-11 MED ORDER — DEXAMETHASONE SODIUM PHOSPHATE 4 MG/ML IJ SOLN
INTRAMUSCULAR | Status: DC | PRN
  Administered 2015-02-11: 4 mg via INTRAVENOUS

## 2015-02-11 MED ORDER — NEOSTIGMINE METHYLSULFATE 10 MG/10ML IV SOLN
INTRAVENOUS | Status: AC
  Filled 2015-02-11: qty 1

## 2015-02-11 MED ORDER — HEPARIN SODIUM (PORCINE) 5000 UNIT/ML IJ SOLN
INTRAMUSCULAR | Status: AC
  Filled 2015-02-11: qty 2

## 2015-02-11 MED ORDER — MIDAZOLAM HCL 5 MG/5ML IJ SOLN
INTRAMUSCULAR | Status: DC | PRN
Start: 2015-02-11 — End: 2015-02-11
  Administered 2015-02-11: 1 mg via INTRAVENOUS

## 2015-02-11 MED ORDER — LACTATED RINGERS IR SOLN
Status: DC | PRN
  Administered 2015-02-11: 3000 mL

## 2015-02-11 MED ORDER — MIDAZOLAM HCL 2 MG/2ML IJ SOLN
INTRAMUSCULAR | Status: AC
  Filled 2015-02-11: qty 2

## 2015-02-11 MED ORDER — GLYCOPYRROLATE 0.2 MG/ML IJ SOLN
INTRAMUSCULAR | Status: AC
  Filled 2015-02-11: qty 3

## 2015-02-11 MED ORDER — ONDANSETRON HCL 4 MG/2ML IJ SOLN
INTRAMUSCULAR | Status: AC
  Filled 2015-02-11: qty 2

## 2015-02-11 MED ORDER — SCOPOLAMINE 1 MG/3DAYS TD PT72
1.0000 | MEDICATED_PATCH | Freq: Once | TRANSDERMAL | Status: DC
  Administered 2015-02-11: 1.5 mg via TRANSDERMAL

## 2015-02-11 MED ORDER — LIDOCAINE HCL (CARDIAC) 20 MG/ML IV SOLN
INTRAVENOUS | Status: DC | PRN
  Administered 2015-02-11: 40 mg via INTRAVENOUS

## 2015-02-11 MED ORDER — EPHEDRINE 5 MG/ML INJ
INTRAVENOUS | Status: AC
  Filled 2015-02-11: qty 10

## 2015-02-11 MED ORDER — PROPOFOL INFUSION 10 MG/ML OPTIME
INTRAVENOUS | Status: DC | PRN
  Administered 2015-02-11: 12 mL via INTRAVENOUS

## 2015-02-11 MED ORDER — LACTATED RINGERS IV SOLN
INTRAVENOUS | Status: DC
  Administered 2015-02-11: 06:00:00 via INTRAVENOUS

## 2015-02-11 MED ORDER — HEPARIN SODIUM (PORCINE) 5000 UNIT/ML IJ SOLN
INTRAMUSCULAR | Status: DC | PRN
  Administered 2015-02-11: 5000 [IU] via SUBCUTANEOUS

## 2015-02-11 MED ORDER — EPHEDRINE SULFATE 50 MG/ML IJ SOLN
INTRAMUSCULAR | Status: DC | PRN
  Administered 2015-02-11: 10 mg via INTRAVENOUS

## 2015-02-11 MED ORDER — LIDOCAINE HCL (CARDIAC) 20 MG/ML IV SOLN
INTRAVENOUS | Status: AC
  Filled 2015-02-11: qty 5

## 2015-02-11 MED ORDER — ROCURONIUM BROMIDE 100 MG/10ML IV SOLN
INTRAVENOUS | Status: DC | PRN
  Administered 2015-02-11: 30 mg via INTRAVENOUS
  Administered 2015-02-11: 5 mg via INTRAVENOUS

## 2015-02-11 MED ORDER — KETOROLAC TROMETHAMINE 30 MG/ML IJ SOLN
INTRAMUSCULAR | Status: DC | PRN
  Administered 2015-02-11: 30 mg via INTRAVENOUS

## 2015-02-11 MED ORDER — BUPIVACAINE HCL (PF) 0.25 % IJ SOLN
INTRAMUSCULAR | Status: AC
  Filled 2015-02-11: qty 60

## 2015-02-11 MED ORDER — GLYCOPYRROLATE 0.2 MG/ML IJ SOLN
INTRAMUSCULAR | Status: DC | PRN
  Administered 2015-02-11: 0.6 mg via INTRAVENOUS

## 2015-02-11 MED ORDER — FENTANYL CITRATE 0.05 MG/ML IJ SOLN
INTRAMUSCULAR | Status: AC
  Filled 2015-02-11: qty 5

## 2015-02-11 MED ORDER — PROPOFOL 10 MG/ML IV BOLUS
INTRAVENOUS | Status: AC
  Filled 2015-02-11: qty 20

## 2015-02-11 MED ORDER — ROCURONIUM BROMIDE 100 MG/10ML IV SOLN
INTRAVENOUS | Status: AC
  Filled 2015-02-11: qty 1

## 2015-02-11 MED ORDER — METHYLENE BLUE 1 % INJ SOLN
INTRAMUSCULAR | Status: AC
  Filled 2015-02-11: qty 1

## 2015-02-11 MED ORDER — SCOPOLAMINE 1 MG/3DAYS TD PT72
MEDICATED_PATCH | TRANSDERMAL | Status: AC
  Administered 2015-02-11: 1.5 mg via TRANSDERMAL
  Filled 2015-02-11: qty 1

## 2015-02-11 MED ORDER — KETOROLAC TROMETHAMINE 30 MG/ML IJ SOLN
INTRAMUSCULAR | Status: AC
Start: 2015-02-11 — End: 2015-02-11
  Filled 2015-02-11: qty 1

## 2015-02-11 MED ORDER — FENTANYL CITRATE 0.05 MG/ML IJ SOLN
INTRAMUSCULAR | Status: DC | PRN
  Administered 2015-02-11 (×2): 50 ug via INTRAVENOUS
  Administered 2015-02-11: 100 ug via INTRAVENOUS
  Administered 2015-02-11: 50 ug via INTRAVENOUS

## 2015-02-11 SURGICAL SUPPLY — 29 items
BARRIER ADHS 3X4 INTERCEED (GAUZE/BANDAGES/DRESSINGS) IMPLANT
CABLE HIGH FREQUENCY MONO STRZ (ELECTRODE) ×3 IMPLANT
CLOTH BEACON ORANGE TIMEOUT ST (SAFETY) ×3 IMPLANT
COVER MAYO STAND STRL (DRAPES) ×3 IMPLANT
DRSG COVADERM PLUS 2X2 (GAUZE/BANDAGES/DRESSINGS) ×3 IMPLANT
DRSG OPSITE POSTOP 3X4 (GAUZE/BANDAGES/DRESSINGS) ×3 IMPLANT
FILTER SMOKE EVAC LAPAROSHD (FILTER) ×3 IMPLANT
GLOVE BIOGEL PI IND STRL 8 (GLOVE) ×2 IMPLANT
GLOVE BIOGEL PI INDICATOR 8 (GLOVE) ×1
GLOVE ECLIPSE 7.5 STRL STRAW (GLOVE) ×6 IMPLANT
GOWN STRL REUS W/TWL LRG LVL3 (GOWN DISPOSABLE) ×6 IMPLANT
LIQUID BAND (GAUZE/BANDAGES/DRESSINGS) ×3 IMPLANT
NS IRRIG 1000ML POUR BTL (IV SOLUTION) ×3 IMPLANT
PACK LAPAROSCOPY BASIN (CUSTOM PROCEDURE TRAY) ×3 IMPLANT
PAD POSITIONER PINK NONSTERILE (MISCELLANEOUS) ×3 IMPLANT
POUCH SPECIMEN RETRIEVAL 10MM (ENDOMECHANICALS) ×3 IMPLANT
PROTECTOR NERVE ULNAR (MISCELLANEOUS) ×3 IMPLANT
SET IRRIG TUBING LAPAROSCOPIC (IRRIGATION / IRRIGATOR) ×3 IMPLANT
SHEARS HARMONIC ACE PLUS 36CM (ENDOMECHANICALS) ×3 IMPLANT
SLEEVE XCEL OPT CAN 5 100 (ENDOMECHANICALS) ×3 IMPLANT
SOLUTION ELECTROLUBE (MISCELLANEOUS) IMPLANT
SUT VIC AB 3-0 PS2 18 (SUTURE) ×1
SUT VIC AB 3-0 PS2 18XBRD (SUTURE) ×2 IMPLANT
SUT VICRYL 0 UR6 27IN ABS (SUTURE) ×3 IMPLANT
TOWEL OR 17X24 6PK STRL BLUE (TOWEL DISPOSABLE) ×6 IMPLANT
TRAY FOLEY CATH SILVER 14FR (SET/KITS/TRAYS/PACK) ×3 IMPLANT
TROCAR XCEL NON-BLD 11X100MML (ENDOMECHANICALS) ×3 IMPLANT
TROCAR XCEL NON-BLD 5MMX100MML (ENDOMECHANICALS) ×3 IMPLANT
WARMER LAPAROSCOPE (MISCELLANEOUS) ×3 IMPLANT

## 2015-02-11 NOTE — H&P (View-Only) (Signed)
Laurence SpatesYanfang Gillespie is an 39 y.o. female. Who presents to the office today for her preoperative examination. Patient with known history of a right ovarian cyst.Patient was seen the office for the first time on 11/11/2014 with the following history:  Patient is a gravida 1 para 1 (prior cesarean section) [place] female who was referred to our practice [facility] as a result of an ultrasound that had been ordered in November 2015 whereby there was concern of irregularity in the uterus at time of the pelvic exam. Patient with no prior history of Pap smear. Patient with no prior history of mammogram. Patient reports having a normal menstrual cycle every 25 days. She is using condoms for contraception. Patient is otherwise healthy. She was asymptomatic today.  Report from November 2015 as follows: Uterus measured 11.6 x 4.7 x 6.1 cm no discrete uterine masses reported. Right ovary measures 4 x 1.9 x 2.57 m. Left ovary measured 2.3 x 1.8 x 1.7 cm. 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  Ultrasound was repeated on 12/25/2014 as follows: Uterus measured 10.5 x 6.7 x 5.1 cm with endometrial stripe of 10.9 mm. Right ovarian solid mass measuring 18 x 19 x 19 mm with calcified wall was noted some posterior shadowing was noted negative color flow. Arterial blood flow was seen in the right ovary. Left ovary corpus luteum cyst measuring 2.9 x 1.8 x 1.8 cm with positive color flow in the periphery.  CA 125 was 42  Patient with persistent calcified right ovarian cyst unchanged in size from November. Concerned of increase CA 125 from a value of 35-42. We discussed recommendation of proceeding with right ovarian cystectomy possible right salpingo-oophorectomy and since she's planning on having no more  children proceeding with left salpingectomy to complete the sterilization.   Pertinent Gynecological History: Menses: Regular Bleeding: Normal Contraception: condoms DES exposure: unknown Blood transfusions: none Sexually transmitted diseases: no past history Previous GYN Procedures: Cesarean section  Last mammogram: normal Date: 2016 Last pap: normal Date: 2016 OB History: G 1, P 1   Menstrual History: Menarche age: [age] Patient's last menstrual period was 02/02/2015.    No past medical history on file.  Past Surgical History  Procedure Laterality Date  . Cesarean section      Family History  Problem Relation Age of Onset  . Hypertension Father   . Diabetes Father     Social History:  reports that she has never smoked. She does not have any smokeless tobacco history on file. She reports that she does not drink alcohol or use illicit drugs.  Allergies: No Known Allergies   (Not in a hospital admission)  REVIEW OF SYSTEMS: A ROS was performed and pertinent positives and negatives are included in the history.  GENERAL: No fevers or chills. HEENT: No change in vision, no earache, sore throat or sinus congestion. NECK: No pain or stiffness. CARDIOVASCULAR: No chest pain or pressure. No palpitations. PULMONARY: No shortness of breath, cough or wheeze. GASTROINTESTINAL: No abdominal pain, nausea, vomiting or diarrhea, melena or bright red blood per rectum. GENITOURINARY: No urinary frequency, urgency, hesitancy or dysuria. MUSCULOSKELETAL: No joint or muscle pain, no back pain, no recent trauma. DERMATOLOGIC: No rash, no itching, no lesions. ENDOCRINE: No polyuria, polydipsia,  no heat or cold intolerance. No recent change in weight. HEMATOLOGICAL: No anemia or easy bruising or bleeding. NEUROLOGIC: No headache, seizures, numbness, tingling or weakness. PSYCHIATRIC: No depression, no loss of interest in normal activity or change in sleep pattern.     Blood pressure 114/68, last  menstrual period 02/02/2015.  Physical Exam:  HEENT:unremarkable Neck:Supple, midline, no thyroid megaly, no carotid bruits Lungs:  Clear to auscultation no rhonchi's or wheezes Heart:Regular rate and rhythm, no murmurs or gallops Breast Exam: Symmetrical in appearance no skin discoloration or nipple inversion no supraclavicular axillary lymphadenopathy Abdomen: Soft nontender no rebound or guarding Pelvic:BUS within normal limits Vagina: Menstrual blood present Cervix: Menstrual blood present Uterus: Anteverted normal size shape and consistency Adnexa: No palpable mass or tenderness Extremities: No cords, no edema Rectal: Deferred   Assessment/plan: Patient with persistent calcified right ovarian cyst unchanged in size from November. Concerned of increase CA 125 from a value of 35-42. Patient will be scheduled to undergo laparoscopic right salpingo-oophorectomy along with left salpingectomy. Patient fully where she will no longer to have children. Patient fully except. Additional risk as follows:                        Patient was counseled as to the risk of surgery to include the following:  1. Infection (prohylactic antibiotics will be administered)  2. DVT/Pulmonary Embolism (prophylactic pneumo compression stockings will be used)  3.Trauma to internal organs requiring additional surgical procedure to repair any injury to     Internal organs requiring perhaps additional hospitalization days.  4.Hemmorhage requiring transfusion and blood products which carry risks such as             anaphylactic reaction, hepatitis and AIDS  Patient had received literature information on the procedure scheduled and all her questions were answered and fully accepts all risk.   [facility] HMD1:18 PMTD     Ok Edwards 02/05/2015, 12:51 PM  Note: This dictation was prepared with  Dragon/digital dictation along withSmart phrase technology. Any transcriptional errors that result from  this process are unintentional.

## 2015-02-11 NOTE — Anesthesia Procedure Notes (Signed)
Date/Time: 02/11/2015 7:40 AM Performed by: Janeece AgeeWRAPE, Yariel Ferraris W Pre-anesthesia Checklist: Patient identified, Emergency Drugs available, Suction available, Patient being monitored and Timeout performed Patient Re-evaluated:Patient Re-evaluated prior to inductionOxygen Delivery Method: Circle system utilized Preoxygenation: Pre-oxygenation with 100% oxygen Intubation Type: IV induction Ventilation: Mask ventilation without difficulty Laryngoscope Size: Mac and 3 Tube type: Oral Tube size: 7.0 mm Number of attempts: 2 Airway Equipment and Method: Stylet Placement Confirmation: ETT inserted through vocal cords under direct vision,  positive ETCO2 and breath sounds checked- equal and bilateral Secured at: 21 cm Tube secured with: Tape Dental Injury: Teeth and Oropharynx as per pre-operative assessment

## 2015-02-11 NOTE — Interval H&P Note (Signed)
History and Physical Interval Note:  02/11/2015 7:16 AM  Lauren Gillespie  has presented today for surgery, with the diagnosis of right ovarian cyst, sterilization  The various methods of treatment have been discussed with the patient and family. After consideration of risks, benefits and other options for treatment, the patient has consented to  Procedure(s) with comments: LAPAROSCOPIC SALPINGO OOPHORECTOMY (Right) - W HARMONIC SCALPEL LAPAROSCOPIC UNILATERAL SALPINGECTOMY (Left) as a surgical intervention .  The patient's history has been reviewed, patient examined, no change in status, stable for surgery.  I have reviewed the patient's chart and labs.  Questions were answered to the patient's satisfaction.     Ok EdwardsFERNANDEZ,Torrance Frech H

## 2015-02-11 NOTE — Progress Notes (Signed)
Ambulated patient to bathroom.  Patient c/o dizziness, pale,nausea and being sleepy. Encouraged pt to take a deep breath,  Ammonia ampule used to wake up pt.  Ambulated safely to recliner with help from two nurses.  Husband with pt.  BP 95/62.  P72.  Taking po fluids well.  IV of Lactated Ringers continued.

## 2015-02-11 NOTE — Anesthesia Postprocedure Evaluation (Signed)
Anesthesia Post Note  Patient: Lauren SpatesYanfang Rosengrant  Procedure(s) Performed: Procedure(s) (LRB): LAPAROSCOPIC SALPINGO OOPHORECTOMY (Right) LAPAROSCOPIC UNILATERAL SALPINGECTOMY (Left)  Anesthesia type: General  Patient location: PACU  Post pain: Pain level controlled  Post assessment: Post-op Vital signs reviewed  Last Vitals:  Filed Vitals:   02/11/15 0945  BP: 101/61  Pulse: 72  Temp:   Resp: 18    Post vital signs: Reviewed  Level of consciousness: sedated  Complications: No apparent anesthesia complications

## 2015-02-11 NOTE — Transfer of Care (Signed)
Immediate Anesthesia Transfer of Care Note  Patient: Lauren Gillespie  Procedure(s) Performed: Procedure(s) with comments: LAPAROSCOPIC SALPINGO OOPHORECTOMY (Right) - W HARMONIC SCALPEL LAPAROSCOPIC UNILATERAL SALPINGECTOMY (Left)  Patient Location: PACU  Anesthesia Type:General  Level of Consciousness: awake, alert  and oriented  Airway & Oxygen Therapy: Patient Spontanous Breathing and Patient connected to nasal cannula oxygen  Post-op Assessment: Report given to RN and Post -op Vital signs reviewed and stable  Post vital signs: Reviewed and stable  Last Vitals:  Filed Vitals:   02/11/15 0603  BP: 87/62  Pulse: 88  Temp: 36.5 C  Resp: 18    Complications: No apparent anesthesia complications

## 2015-02-11 NOTE — Op Note (Signed)
Operative Note  02/11/2015  9:04 AM  PATIENT:  Lauren Gillespie  39 y.o. female  PRE-OPERATIVE DIAGNOSIS:  right ovarian cyst, sterilization  POST-OPERATIVE DIAGNOSIS:  right ovarian cyst, sterilization  PROCEDURE:  Procedure(s): LAPAROSCOPIC RIGHT SALPINGO OOPHORECTOMY LAPAROSCOPIC LEFT SALPINGECTOMY Salpingectomy Pelvic washings  SURGEON:  Surgeon(s): Ok Edwards, MD Dara Lords, MD  ANESTHESIA:   general  FINDINGS: Normal-appearing liver, appendix. No pelvic adhesions were noted. Anterior-posterior: Sac without any adhesions or endometriotic implants or any other abnormality. Both fallopian tubes appeared normal left ovary appeared normal. Right ovary slightly bigger than the left ovary no abnormalities noted on the external surface. Questionable endometrioma.  DESCRIPTION OF OPERATION: The patient was taken to the operating room where she underwent a successful general endotracheal anesthesia. A timeout was undertaken. Patient was identified as well as the procedure to be performed was forced out loud before commencement. Patient had received 2 g of Cefotan preoperatively for prophylaxis. Patient also had PAS stockings for DVT prophylaxis. The abdomen vagina and perineum were prepped and draped in usual sterile fashion. Exam under anesthesia demonstrated anteverted uterus. A single-tooth tenaculum was placed for manipulation during laparoscopic procedure. A Foley catheter was inserted to monitor urinary output. After the drapes were in place a small semilunar incision was made in the infra-umbilicus region followed by insertion of a 10/11 mm Optiview trocar. A pneumoperitoneum was established with approximately 3 L her, and oxide. 2 additional 5 mm ports were placed in the patient's right and left lower abdomen under laparoscopic guidance. A systematic inspection of the abdomen pelvic with findings as described above. Pelvic washings were obtained and submitted for cytological  evaluation. The right tube and ovary were placed on traction to identify the right ureter. The right infundibulopelvic ligament was coaptated and transected with Harmonic scalpel. As well as the remaining right nasal salpinx. The right utero-ovarian ligament and proximal fallopian tube were also coaptated and transected. The specimen was placed in the cul-de-sac. Attention was placed in the left fallopian tube the left ureter was identified as well. The left fallopian tube nasal salpinx was coaptated and transected with Harmonic scalpel to the level of the uterotubal tubal juncture and also coaptated and transected. Both specimens were then placed in an Endopouch retrieved and submitted for histological evaluation. Systematic inspection of the pelvic cavity demonstrated adequate hemostasis. The pneumoperitoneum was removed. The sub-umbilicus fashion was closed with a running stitch of 0 Vicryl suture and the septic anus tissue was reapproximated with 3-0 Vicryl suture. All 3 port skin edges were reapproximated with Dermabond glue. For postoperative analgesia 0.25% Marcaine was infiltrated at all 3 port sites for a total of 16 cc. Band-Aids were then placed on 3 incision ports. The Hulka tenaculum was removed. The cervix was inspected there was no any further bleeding small area was contained with silver nitrate. The patient was extubated transferred to recovery with stable vital signs. If no her Foley catheter was removed. Approximately 3 m cc of clear urine was present. Patient did receive Toradol 30 mg IV in route to the recovery room.  ESTIMATED BLOOD LOSS: Less than 25 cc   Intake/Output Summary (Last 24 hours) at 02/11/15 0904 Last data filed at 02/11/15 0855  Gross per 24 hour  Intake    700 ml  Output    415 ml  Net    285 ml     BLOOD ADMINISTERED:none   LOCAL MEDICATIONS USED:  MARCAINE   0.25% injected at all 3 incision ports  for a total of 16 cc  SPECIMEN:  Source of Specimen:  Right tube  and ovary, left fallopian tube, pelvic washings  DISPOSITION OF SPECIMEN:  PATHOLOGY  COUNTS:  YES  PLAN OF CARE: Transfer to PACU  [facility] HospitalFERNANDEZ,Ivy Meriwether HMD9:04 AMTD@

## 2015-02-12 ENCOUNTER — Encounter (HOSPITAL_COMMUNITY): Payer: Self-pay | Admitting: Gynecology

## 2015-02-17 ENCOUNTER — Telehealth: Payer: Self-pay

## 2015-02-17 NOTE — Telephone Encounter (Signed)
Please see the second part of my note below. Ok to remove bandaid from incision?

## 2015-02-17 NOTE — Telephone Encounter (Signed)
Patient had Lap SO on 02/11/15.  Is having vaginal spotting and sees is everytime she wipes with bathroom trips.  Any need for concern?  Her menses is not due unto 03/03/15.  I asked was everything else ok. No pain and no fever. I could not understand completely what he was asking about the incision but something about the bandaid and I believe they have not yet removed it.  Is that something that she can do now?

## 2015-02-17 NOTE — Telephone Encounter (Signed)
Not unusual to have some spotting. If she like she can come by the office this week if it continues

## 2015-02-17 NOTE — Telephone Encounter (Signed)
Benny informed.

## 2015-02-17 NOTE — Telephone Encounter (Signed)
Yes she can remove the Band-Aid

## 2015-02-25 ENCOUNTER — Ambulatory Visit: Payer: BLUE CROSS/BLUE SHIELD | Admitting: Gynecology

## 2015-02-27 ENCOUNTER — Encounter: Payer: Self-pay | Admitting: Gynecology

## 2015-02-27 ENCOUNTER — Ambulatory Visit (INDEPENDENT_AMBULATORY_CARE_PROVIDER_SITE_OTHER): Payer: BLUE CROSS/BLUE SHIELD | Admitting: Gynecology

## 2015-02-27 DIAGNOSIS — Z09 Encounter for follow-up examination after completed treatment for conditions other than malignant neoplasm: Secondary | ICD-10-CM

## 2015-02-27 DIAGNOSIS — R971 Elevated cancer antigen 125 [CA 125]: Secondary | ICD-10-CM

## 2015-02-27 NOTE — Progress Notes (Signed)
   Patient presented to the office today for her two-week postop visit. On 02/11/2015 patient had a laparoscopic right salpingo-oophorectomy left salpingectomy as a result of enlargement of right ovary with calcification. Patient requesting sterilization as well. Procedure as well as findings and pathology report and pictures were shared with the patient as follows:  FINDINGS: Normal-appearing liver, appendix. No pelvic adhesions were noted. Anterior-posterior: Sac without any adhesions or endometriotic implants or any other abnormality. Both fallopian tubes appeared normal left ovary appeared normal. Right ovary slightly bigger than the left ovary no abnormalities noted on the external surface. Questionable endometrioma  Pathology report:   Diagnosis 1. 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 - BENIGN FALLOPIAN TUBE WITH PARATUBAL CYST. 2. Fallopian tube, left - BENIGN FALLOPIAN TUBE WITH PARATUBAL CYSTS. Microscopic Comment 1. 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 Because of the association of heterotopic bone and ovarian neoplasms or endometriosis, the entire ovary was submitted. No ovarian neoplasms or endometriosis are dentified  Patient is doing well some mild constipation but otherwise doing well.  Exam: Soft nontender no rebound or guarding. Incision ports completely healed. Positive bowel sounds all 4 quadrants.  Back: No CVA tenderness Pelvic: Bartholin urethra Skene was within normal limits Vagina: No lesions or discharge Cervix no lesions or discharge Uterus: Anteverted normal size shape and consistency Adnexa: No palpable masses or tenderness Rectal exam not done  Assessment/plan: Patient 2 weeks status post laparoscopic right salpingo-oophorectomy and left salpingectomy as a result of her slightly enlarged with a right ovary with calcification pathology report benign. Patient  had preoperatively elevated CA 125 that went from 35 on 11/11/2014 to evaluate a 42 before surgery on 12/16/2014. We are going to check her CA 125 today. Patient may resume full normal activity.

## 2015-02-28 ENCOUNTER — Other Ambulatory Visit: Payer: Self-pay | Admitting: Gynecology

## 2015-02-28 DIAGNOSIS — N83209 Unspecified ovarian cyst, unspecified side: Secondary | ICD-10-CM

## 2015-02-28 LAB — CA 125: CA 125: 39 U/mL — ABNORMAL HIGH (ref <35)

## 2016-02-17 ENCOUNTER — Other Ambulatory Visit: Payer: Self-pay

## 2016-02-17 DIAGNOSIS — Z1231 Encounter for screening mammogram for malignant neoplasm of breast: Secondary | ICD-10-CM

## 2016-03-04 ENCOUNTER — Ambulatory Visit
Admission: RE | Admit: 2016-03-04 | Discharge: 2016-03-04 | Disposition: A | Discharge status: Home / Self Care | Payer: BLUE CROSS/BLUE SHIELD | Source: Ambulatory Visit

## 2016-03-04 DIAGNOSIS — Z1231 Encounter for screening mammogram for malignant neoplasm of breast: Secondary | ICD-10-CM

## 2017-03-23 ENCOUNTER — Encounter: Payer: Self-pay | Admitting: Gynecology

## 2017-05-18 ENCOUNTER — Ambulatory Visit
Admission: RE | Admit: 2017-05-18 | Discharge: 2017-05-18 | Disposition: A | Discharge status: Home / Self Care | Payer: BLUE CROSS/BLUE SHIELD | Source: Ambulatory Visit | Attending: Gynecology | Admitting: Gynecology

## 2017-05-18 ENCOUNTER — Other Ambulatory Visit: Payer: Self-pay | Admitting: Gynecology

## 2017-05-18 DIAGNOSIS — Z1231 Encounter for screening mammogram for malignant neoplasm of breast: Secondary | ICD-10-CM

## 2018-01-01 IMAGING — MG 2D DIGITAL SCREENING BILATERAL MAMMOGRAM WITH CAD AND ADJUNCT TO
9 of 12 series · 9 of 28 positions shown · non-contrast
Comparison: Previous exam(s).

CLINICAL DATA: Screening.

EXAM:
2D DIGITAL SCREENING BILATERAL MAMMOGRAM WITH CAD AND ADJUNCT TOMO

[R MLO]
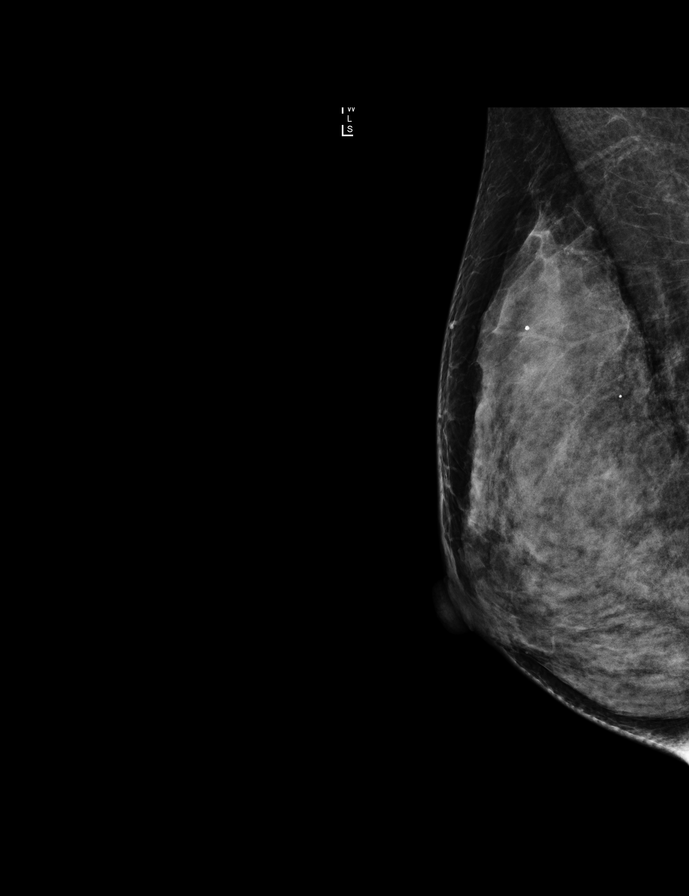

[L CC]
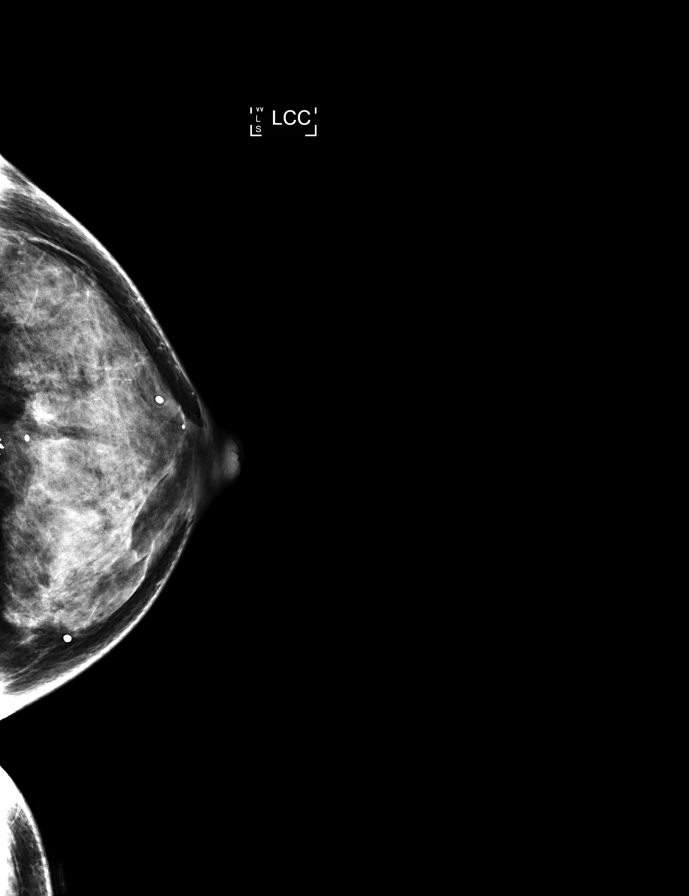

[R MLO synth-2D]
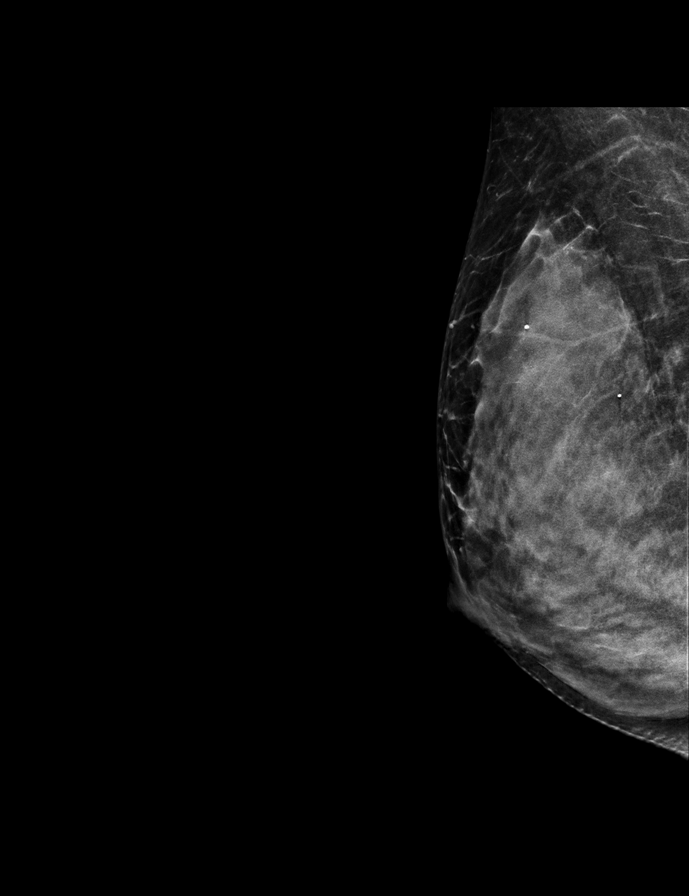

[R CC synth-2D]
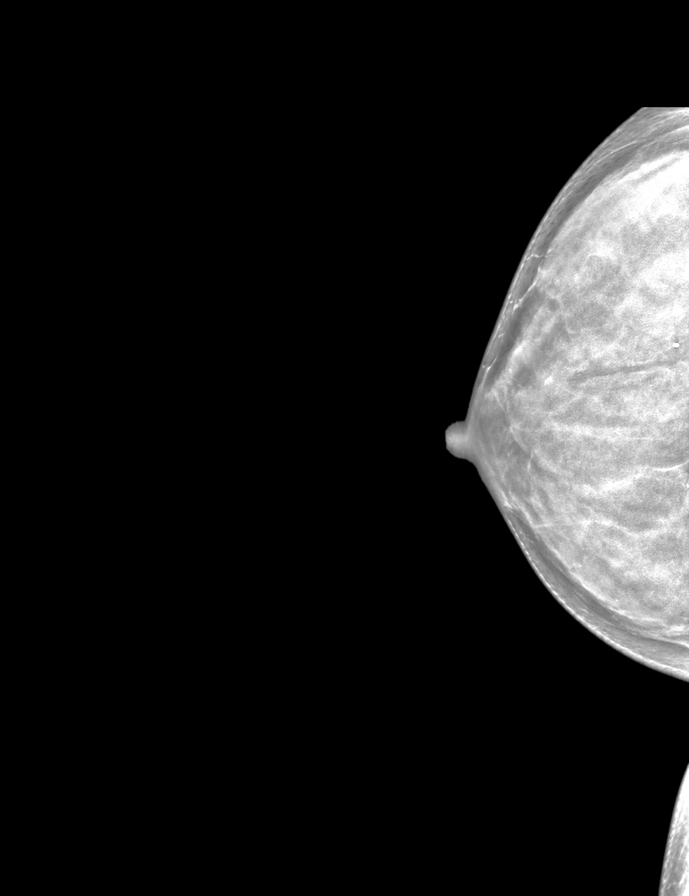

[L MLO synth-2D]
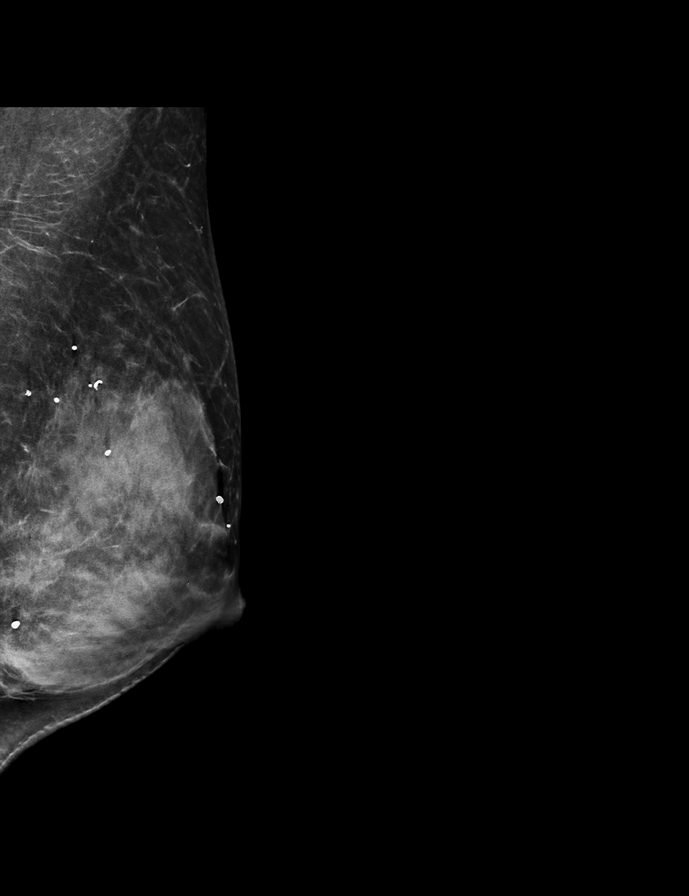

[R CC]
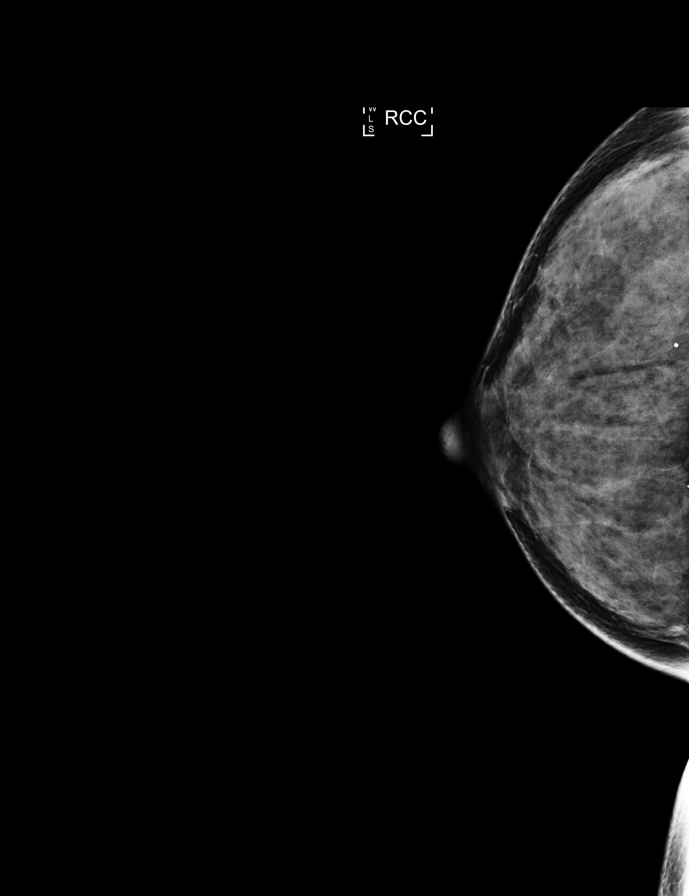

[L CC synth-2D]
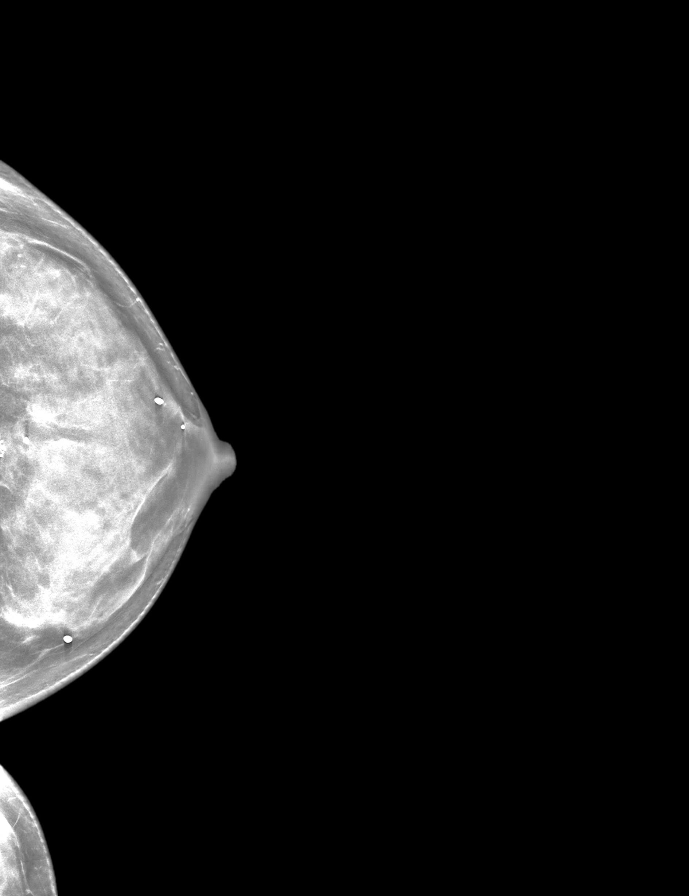

[L MLO]
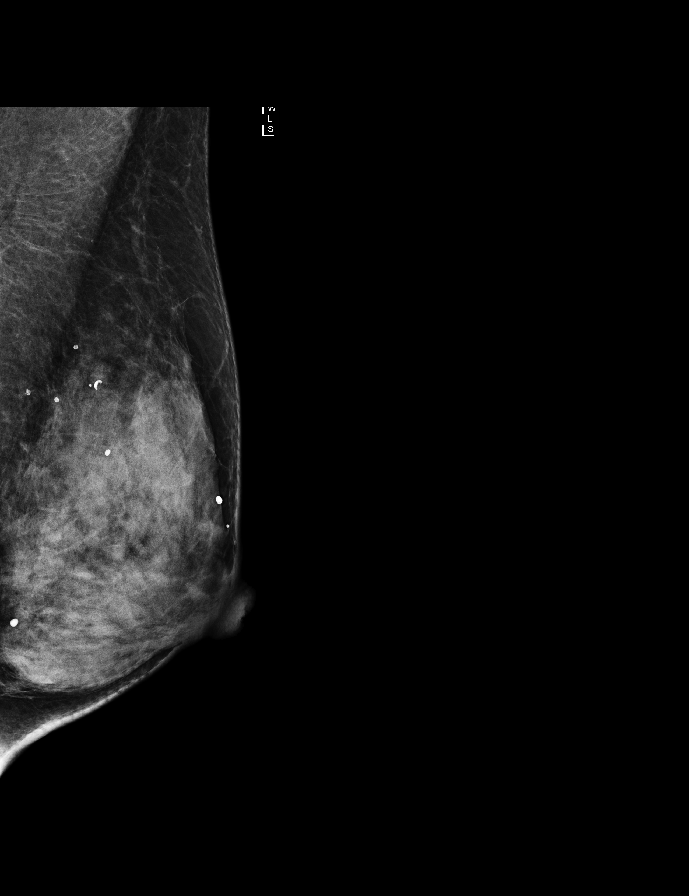

[L MLO tomo · tomo slice 29/57.0]
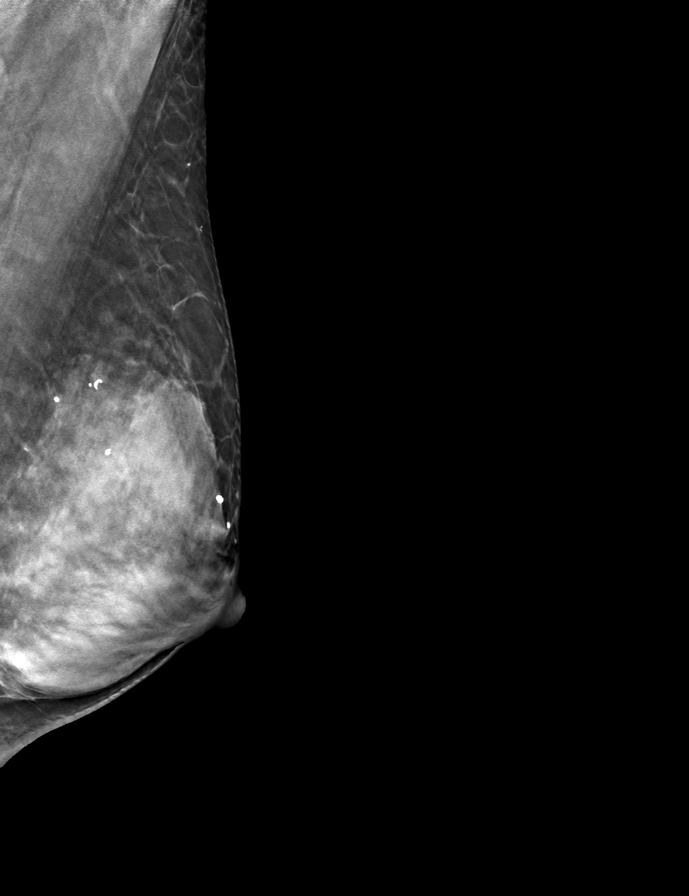

[9 of 28 positions shown; findings below may reference images not displayed]

ACR Breast Density Category c: The breast tissue is heterogeneously
dense, which may obscure small masses.
FINDINGS: There are no findings suspicious for malignancy. Images were
processed with CAD.
IMPRESSION: No mammographic evidence of malignancy. A result letter of this
screening mammogram will be mailed directly to the patient.

RECOMMENDATION:
Screening mammogram in one year. (Code:TN-[id number])

BI-RADS CATEGORY  1: Negative.

## 2019-06-14 ENCOUNTER — Encounter: Payer: BLUE CROSS/BLUE SHIELD | Admitting: Obstetrics and Gynecology
